# Patient Record
Sex: Female | Born: 1960
Health system: Southern US, Community
[De-identification: ages and names within clinical notes are randomized; demographics above are authoritative.]

---

## 2007-10-29 ENCOUNTER — Encounter: Admission: RE | Admit: 2007-10-29 | Discharge: 2007-10-29 | Payer: Self-pay | Admitting: Family Medicine

## 2008-12-10 ENCOUNTER — Encounter: Admission: RE | Admit: 2008-12-10 | Discharge: 2008-12-10 | Payer: Self-pay | Admitting: Family Medicine

## 2009-02-10 ENCOUNTER — Encounter: Admission: RE | Admit: 2009-02-10 | Discharge: 2009-02-10 | Payer: Self-pay | Admitting: Gastroenterology

## 2009-09-19 ENCOUNTER — Emergency Department (HOSPITAL_COMMUNITY): Admission: EM | Admit: 2009-09-19 | Discharge: 2009-09-19 | Payer: Self-pay | Admitting: Emergency Medicine

## 2009-09-21 ENCOUNTER — Emergency Department (HOSPITAL_COMMUNITY): Admission: EM | Admit: 2009-09-21 | Discharge: 2009-09-21 | Payer: Self-pay | Admitting: Emergency Medicine

## 2018-06-26 ENCOUNTER — Other Ambulatory Visit: Payer: Self-pay | Admitting: Internal Medicine

## 2018-06-26 DIAGNOSIS — Z8719 Personal history of other diseases of the digestive system: Secondary | ICD-10-CM | POA: Diagnosis not present

## 2018-06-26 DIAGNOSIS — Z1231 Encounter for screening mammogram for malignant neoplasm of breast: Secondary | ICD-10-CM

## 2018-06-26 DIAGNOSIS — F17210 Nicotine dependence, cigarettes, uncomplicated: Secondary | ICD-10-CM | POA: Diagnosis not present

## 2018-06-26 DIAGNOSIS — R030 Elevated blood-pressure reading, without diagnosis of hypertension: Secondary | ICD-10-CM | POA: Diagnosis not present

## 2018-07-16 ENCOUNTER — Other Ambulatory Visit: Payer: Self-pay | Admitting: Internal Medicine

## 2018-07-16 ENCOUNTER — Other Ambulatory Visit (HOSPITAL_COMMUNITY)
Admission: RE | Admit: 2018-07-16 | Discharge: 2018-07-16 | Disposition: A | Discharge status: Home / Self Care | Payer: 59 | Source: Ambulatory Visit | Attending: Internal Medicine | Admitting: Internal Medicine

## 2018-07-16 DIAGNOSIS — Z124 Encounter for screening for malignant neoplasm of cervix: Secondary | ICD-10-CM | POA: Diagnosis present

## 2018-07-16 DIAGNOSIS — F17210 Nicotine dependence, cigarettes, uncomplicated: Secondary | ICD-10-CM | POA: Diagnosis not present

## 2018-07-16 DIAGNOSIS — Z0000 Encounter for general adult medical examination without abnormal findings: Secondary | ICD-10-CM | POA: Diagnosis not present

## 2018-07-19 LAB — CYTOLOGY - PAP
Diagnosis: NEGATIVE
HPV: NOT DETECTED

## 2018-07-24 ENCOUNTER — Ambulatory Visit
Admission: RE | Admit: 2018-07-24 | Discharge: 2018-07-24 | Disposition: A | Discharge status: Home / Self Care | Payer: 59 | Source: Ambulatory Visit | Attending: Internal Medicine | Admitting: Internal Medicine

## 2018-07-24 DIAGNOSIS — Z1231 Encounter for screening mammogram for malignant neoplasm of breast: Secondary | ICD-10-CM | POA: Diagnosis not present

## 2018-08-31 DIAGNOSIS — R51 Headache: Secondary | ICD-10-CM | POA: Diagnosis not present

## 2018-08-31 DIAGNOSIS — R0789 Other chest pain: Secondary | ICD-10-CM | POA: Diagnosis not present

## 2019-09-22 ENCOUNTER — Ambulatory Visit
Admission: EM | Admit: 2019-09-22 | Discharge: 2019-09-22 | Disposition: A | Discharge status: Home / Self Care | Payer: 59

## 2019-09-22 ENCOUNTER — Other Ambulatory Visit: Payer: Self-pay

## 2019-09-22 ENCOUNTER — Encounter: Payer: Self-pay | Admitting: Emergency Medicine

## 2019-09-22 DIAGNOSIS — Z720 Tobacco use: Secondary | ICD-10-CM

## 2019-09-22 DIAGNOSIS — J069 Acute upper respiratory infection, unspecified: Secondary | ICD-10-CM | POA: Diagnosis not present

## 2019-09-22 MED ORDER — AZITHROMYCIN 250 MG PO TABS: 250.0000 mg | ORAL_TABLET | Freq: Every day | ORAL | 0 refills | Status: DC

## 2019-09-22 MED ORDER — BENZONATATE 100 MG PO CAPS: 100.0000 mg | ORAL_CAPSULE | Freq: Three times a day (TID) | ORAL | 0 refills | Status: DC

## 2019-09-22 NOTE — Discharge Instructions (Addendum)

## 2019-09-22 NOTE — ED Provider Notes (Signed)
[facility]    CSN: [id number] Arrival date & time: 09/22/19  1015      History   Chief Complaint Chief Complaint  Patient presents with  . URI    HPI Kathryn Harrison is a 59 y.o. female with history of tobacco use (currently 1/2 pack/day) presenting for URI symptoms x4 days.  Endorsing fatigue, malaise, rhinorrhea, chest congestion, dry/nonproductive cough.  No fever, chest pain, difficulty breathing.  No known Covid exposures, not currently taking anything for symptoms.   History reviewed. No pertinent past medical history.  There are no problems to display for this patient.   History reviewed. No pertinent surgical history.  OB History   No obstetric history on file.      Home Medications    Prior to Admission medications   Medication Sig Start Date End Date Taking? Authorizing Provider  aspirin EC 81 MG tablet Take 81 mg by mouth daily.   Yes [provider]  Multiple Vitamin (MULTIVITAMIN) tablet Take 1 tablet by mouth daily.   Yes [provider]  VITAMIN D PO Take by mouth.   Yes [provider]  azithromycin (ZITHROMAX) 250 MG tablet Take 1 tablet (250 mg total) by mouth daily. Take first 2 tablets together, then 1 every day until finished. 09/22/19   Hall-Potvin, [place], PA-C  benzonatate (TESSALON) 100 MG capsule Take 1 capsule (100 mg total) by mouth every 8 (eight) hours. 09/22/19   Hall-Potvin, [place], PA-C    Family History Family History  Problem Relation Age of Onset  . Hypertension Mother     Social History Social History   Tobacco Use  . Smoking status: Current Every Day Smoker  Substance Use Topics  . Alcohol use: Yes  . Drug use: Never     Allergies   Patient has no known allergies.   Review of Systems As per HPI   Physical Exam Triage Vital Signs ED Triage Vitals  Enc Vitals Group     BP 09/22/19 1031 (!) 151/89     Pulse Rate 09/22/19 1031 83     Resp 09/22/19 1031 18     Temp  09/22/19 1031 98.4 F (36.9 C)     Temp Source 09/22/19 1031 Oral     SpO2 09/22/19 1031 98 %     Weight --      Height --      Head Circumference --      Peak Flow --      Pain Score 09/22/19 1026 0     Pain Loc --      Pain Edu? --      Excl. in [place]? --    No data found.  Updated Vital Signs BP (!) 151/89 (BP Location: Left Arm)   Pulse 83   Temp 98.4 F (36.9 C) (Oral)   Resp 18   SpO2 98%   Visual Acuity Right Eye Distance:   Left Eye Distance:   Bilateral Distance:    Right Eye Near:   Left Eye Near:    Bilateral Near:     Physical Exam Constitutional:      General: She is not in acute distress.    Appearance: She is obese. She is not ill-appearing or diaphoretic.  HENT:     Head: Normocephalic and atraumatic.     Mouth/Throat:     Mouth: Mucous membranes are moist.     Pharynx: Oropharynx is clear. No oropharyngeal exudate or posterior oropharyngeal erythema.  Eyes:  General: No scleral icterus.    Conjunctiva/sclera: Conjunctivae normal.     Pupils: Pupils are equal, round, and reactive to light.  Neck:     Comments: Trachea midline, negative JVD Cardiovascular:     Rate and Rhythm: Normal rate and regular rhythm.     Heart sounds: No murmur. No gallop.   Pulmonary:     Effort: Pulmonary effort is normal. No respiratory distress.     Breath sounds: No wheezing, rhonchi or rales.  Musculoskeletal:     Cervical back: Neck supple. No tenderness.  Lymphadenopathy:     Cervical: No cervical adenopathy.  Skin:    Capillary Refill: Capillary refill takes less than 2 seconds.     Coloration: Skin is not jaundiced or pale.     Findings: No rash.  Neurological:     General: No focal deficit present.     Mental Status: She is alert and oriented to person, place, and time.      UC Treatments / Results  Labs (all labs ordered are listed, but only abnormal results are displayed) Labs Reviewed  NOVEL CORONAVIRUS, NAA    EKG   Radiology No  results found.  Procedures Procedures (including critical care time)  Medications Ordered in UC Medications - No data to display  Initial Impression / Assessment and Plan / UC Course  I have reviewed the triage vital signs and the nursing notes.  Pertinent labs & imaging results that were available during my care of the patient were reviewed by me and considered in my medical decision making (see chart for details).     Patient afebrile, nontoxic, with SpO2 98%.  Patient reports recurrent bronchitis but denies recent hospitalization for breathing issues.  Given persistent tobacco use with setting of recurrent bronchitis, willing to provide Z-Pak which patient has tolerated well in the past.  Covid PCR pending.  Patient to quarantine until results are back.  We will treat supportively as outlined below.  Return precautions discussed, patient verbalized understanding and is agreeable to plan. Final Clinical Impressions(s) / UC Diagnoses   Final diagnoses:  URI with cough and congestion     Discharge Instructions     Tessalon for cough. Start flonase, atrovent nasal spray for nasal congestion/drainage. You can use over the counter nasal saline rinse such as neti pot for nasal congestion. Keep hydrated, your urine should be clear to pale yellow in color. Tylenol/motrin for fever and pain. Monitor for any worsening of symptoms, chest pain, shortness of breath, wheezing, swelling of the throat, go to the emergency department for further evaluation needed.     ED Prescriptions    Medication Sig Dispense Auth. Provider   azithromycin (ZITHROMAX) 250 MG tablet Take 1 tablet (250 mg total) by mouth daily. Take first 2 tablets together, then 1 every day until finished. 6 tablet Hall-Potvin, [place], PA-C   benzonatate (TESSALON) 100 MG capsule Take 1 capsule (100 mg total) by mouth every 8 (eight) hours. 21 capsule Hall-Potvin, [place], PA-C     PDMP not reviewed this encounter.    Hall-Potvin, [place], [place] 09/22/19 1257

## 2019-09-22 NOTE — ED Triage Notes (Signed)
Onset 4 days ago.  Patient has had runny nose, chest congestion, minimal cough.  Denies fever.  Today has started feeling drained, off balance, feels like she is in a fog

## 2019-09-23 LAB — NOVEL CORONAVIRUS, NAA: SARS-CoV-2, NAA: NOT DETECTED

## 2019-09-23 LAB — SARS-COV-2, NAA 2 DAY TAT

## 2019-09-25 ENCOUNTER — Other Ambulatory Visit: Payer: Self-pay | Admitting: Internal Medicine

## 2019-09-25 DIAGNOSIS — Z1231 Encounter for screening mammogram for malignant neoplasm of breast: Secondary | ICD-10-CM

## 2019-09-27 ENCOUNTER — Ambulatory Visit
Admission: RE | Admit: 2019-09-27 | Discharge: 2019-09-27 | Disposition: A | Discharge status: Home / Self Care | Payer: 59 | Source: Ambulatory Visit | Attending: Internal Medicine | Admitting: Internal Medicine

## 2019-09-27 ENCOUNTER — Other Ambulatory Visit: Payer: Self-pay

## 2019-09-27 DIAGNOSIS — Z1231 Encounter for screening mammogram for malignant neoplasm of breast: Secondary | ICD-10-CM

## 2019-09-28 ENCOUNTER — Ambulatory Visit
Admission: EM | Admit: 2019-09-28 | Discharge: 2019-09-28 | Disposition: A | Discharge status: Home / Self Care | Payer: 59 | Attending: Physician Assistant | Admitting: Physician Assistant

## 2019-09-28 ENCOUNTER — Other Ambulatory Visit: Payer: Self-pay

## 2019-09-28 DIAGNOSIS — J209 Acute bronchitis, unspecified: Secondary | ICD-10-CM | POA: Diagnosis not present

## 2019-09-28 MED ORDER — METHYLPREDNISOLONE 4 MG PO TBPK: ORAL_TABLET | ORAL | 0 refills | Status: DC

## 2019-09-28 MED ORDER — ALBUTEROL SULFATE HFA 108 (90 BASE) MCG/ACT IN AERS: 1.0000 | INHALATION_SPRAY | Freq: Four times a day (QID) | RESPIRATORY_TRACT | 0 refills | Status: AC | PRN

## 2019-09-28 MED ORDER — AZELASTINE HCL 0.1 % NA SOLN: 2.0000 | Freq: Two times a day (BID) | NASAL | 0 refills | Status: AC

## 2019-09-28 MED ORDER — AZELASTINE HCL 0.1 % NA SOLN: 2.0000 | Freq: Two times a day (BID) | NASAL | 0 refills | Status: DC

## 2019-09-28 MED ORDER — METHYLPREDNISOLONE 4 MG PO TBPK: ORAL_TABLET | ORAL | 0 refills | Status: AC

## 2019-09-28 MED ORDER — ALBUTEROL SULFATE HFA 108 (90 BASE) MCG/ACT IN AERS: 1.0000 | INHALATION_SPRAY | Freq: Four times a day (QID) | RESPIRATORY_TRACT | 0 refills | Status: DC | PRN

## 2019-09-28 NOTE — ED Triage Notes (Signed)
Patient presents for follow up of chest congestion and cough that has persisted after finishing up a Zpak x 3 days ago.

## 2019-09-28 NOTE — Discharge Instructions (Addendum)
Start medrol pack, can take at night time. If cannot tolerate steroid, can discontinue and fill albuterol as needed. Start flonase daily for the next week, if left ear symptoms not improving, can add azelastine as directed. Monitor for any worsening of symptoms, chest pain, shortness of breath, wheezing, swelling of the throat, go to the emergency department for further evaluation needed.

## 2019-09-28 NOTE — ED Provider Notes (Signed)
[facility]    CSN: [id number] Arrival date & time: 09/28/19  1038      History   Chief Complaint Chief Complaint  Patient presents with  . Cough    chest congestion    HPI Kathryn Harrison is a 59 y.o. female.   59 year old female comes in for continued chest tightness, feelings of chest congestion after being seen 5 days ago. At the time, she had 4 day history of symptoms, and was started on azithromycin. States her sinus pressure has resolved. Minimal cough. Denies fever, body aches, shortness of breath. Current everday smoker, 0.5ppd, 10-15 years.  COVID test last visit negative.      History reviewed. No pertinent past medical history.  There are no problems to display for this patient.   History reviewed. No pertinent surgical history.  OB History   No obstetric history on file.      Home Medications    Prior to Admission medications   Medication Sig Start Date End Date Taking? Authorizing Provider  albuterol (VENTOLIN HFA) 108 (90 Base) MCG/ACT inhaler Inhale 1-2 puffs into the lungs every 6 (six) hours as needed for wheezing or shortness of breath. 09/28/19   Belinda Fisher, PA-C  aspirin EC 81 MG tablet Take 81 mg by mouth daily.    [provider]  azelastine (ASTELIN) 0.1 % nasal spray Place 2 sprays into both nostrils 2 (two) times daily. 09/28/19   Cathie Hoops, Amy V, PA-C  methylPREDNISolone (MEDROL DOSEPAK) 4 MG TBPK tablet Follow pack instructions 09/28/19   Linward Headland V, PA-C  Multiple Vitamin (MULTIVITAMIN) tablet Take 1 tablet by mouth daily.    [provider]  VITAMIN D PO Take by mouth.    [provider]    Family History Family History  Problem Relation Age of Onset  . Hypertension Mother     Social History Social History   Tobacco Use  . Smoking status: Current Every Day Smoker  . Smokeless tobacco: Never Used  Substance Use Topics  . Alcohol use: Yes  . Drug use: Never     Allergies   Patient has no  known allergies.   Review of Systems Review of Systems  Reason unable to perform ROS: See HPI as above.     Physical Exam Triage Vital Signs ED Triage Vitals  Enc Vitals Group     BP 09/28/19 1046 (!) 153/92     Pulse Rate 09/28/19 1046 76     Resp 09/28/19 1046 14     Temp 09/28/19 1046 97.8 F (36.6 C)     Temp Source 09/28/19 1046 Oral     SpO2 09/28/19 1046 98 %     Weight --      Height --      Head Circumference --      Peak Flow --      Pain Score 09/28/19 1048 5     Pain Loc --      Pain Edu? --      Excl. in [place]? --    No data found.  Updated Vital Signs BP (!) 153/92 (BP Location: Left Arm)   Pulse 76   Temp 97.8 F (36.6 C) (Oral)   Resp 14   SpO2 98%   Physical Exam Constitutional:      General: She is not in acute distress.    Appearance: Normal appearance. She is not ill-appearing, toxic-appearing or diaphoretic.  HENT:     Head:  Normocephalic and atraumatic.     Right Ear: Tympanic membrane, ear canal and external ear normal. Tympanic membrane is not erythematous or bulging.     Ears:     Comments: Left ear TM with blood at 4 oclock region with blood to the ear canal. Mid ear effusion noted. Rest of TM without erythema, bulging.     Mouth/Throat:     Mouth: Mucous membranes are moist.     Pharynx: Oropharynx is clear. Uvula midline.  Cardiovascular:     Rate and Rhythm: Normal rate and regular rhythm.     Heart sounds: Normal heart sounds. No murmur. No friction rub. No gallop.   Pulmonary:     Effort: Pulmonary effort is normal. No accessory muscle usage, prolonged expiration, respiratory distress or retractions.     Comments: Lungs clear to auscultation without adventitious lung sounds. Musculoskeletal:     Cervical back: Normal range of motion and neck supple.  Neurological:     General: No focal deficit present.     Mental Status: She is alert and oriented to person, place, and time.      UC Treatments / Results  Labs (all labs  ordered are listed, but only abnormal results are displayed) Labs Reviewed - No data to display  EKG   Radiology  Procedures Procedures (including critical care time)  Medications Ordered in UC Medications - No data to display  Initial Impression / Assessment and Plan / UC Course  I have reviewed the triage vital signs and the nursing notes.  Pertinent labs & imaging results that were available during my care of the patient were reviewed by me and considered in my medical decision making (see chart for details).    Lungs clear to auscultation bilaterally without adventitious lung sounds.  O2 sat 98%.  Discussed history and exam consistent with bronchitis.  Patient with history of side effects with prednisone, will try methylprednisolone.  If still with significant side effects, can use albuterol for symptomatic relief.  Other symptomatic relief discussed.  Smoking cessation discussed.  Return precautions given.  Patient expresses understanding and agrees to plan.  Final Clinical Impressions(s) / UC Diagnoses   Final diagnoses:  Acute bronchitis, unspecified organism    ED Prescriptions    Medication Sig Dispense Auth. Provider   methylPREDNISolone (MEDROL DOSEPAK) 4 MG TBPK tablet  (Status: Discontinued) Follow pack instructions 21 each Yu, Amy V, PA-C   albuterol (VENTOLIN HFA) 108 (90 Base) MCG/ACT inhaler  (Status: Discontinued) Inhale 1-2 puffs into the lungs every 6 (six) hours as needed for wheezing or shortness of breath. 8 g Yu, Amy V, PA-C   azelastine (ASTELIN) 0.1 % nasal spray  (Status: Discontinued) Place 2 sprays into both nostrils 2 (two) times daily. 30 mL Yu, Amy V, PA-C   albuterol (VENTOLIN HFA) 108 (90 Base) MCG/ACT inhaler Inhale 1-2 puffs into the lungs every 6 (six) hours as needed for wheezing or shortness of breath. 8 g Yu, Amy V, PA-C   azelastine (ASTELIN) 0.1 % nasal spray Place 2 sprays into both nostrils 2 (two) times daily. 30 mL Yu, Amy V, PA-C    methylPREDNISolone (MEDROL DOSEPAK) 4 MG TBPK tablet Follow pack instructions 21 each Ok Edwards, PA-C     PDMP not reviewed this encounter.   Ok Edwards, PA-C 09/28/19 1143

## 2019-09-30 ENCOUNTER — Other Ambulatory Visit: Payer: Self-pay | Admitting: Internal Medicine

## 2019-09-30 DIAGNOSIS — R928 Other abnormal and inconclusive findings on diagnostic imaging of breast: Secondary | ICD-10-CM

## 2019-10-07 ENCOUNTER — Ambulatory Visit
Admission: RE | Admit: 2019-10-07 | Discharge: 2019-10-07 | Disposition: A | Discharge status: Home / Self Care | Payer: 59 | Source: Ambulatory Visit | Attending: Internal Medicine | Admitting: Internal Medicine

## 2019-10-07 ENCOUNTER — Other Ambulatory Visit: Payer: Self-pay

## 2019-10-07 ENCOUNTER — Ambulatory Visit: Payer: 59

## 2019-10-07 DIAGNOSIS — R928 Other abnormal and inconclusive findings on diagnostic imaging of breast: Secondary | ICD-10-CM

## 2020-02-01 IMAGING — MG DIGITAL SCREENING BILATERAL MAMMOGRAM WITH CAD
4 series · 4 of 4 positions shown · non-contrast
Comparison: Previous exam(s).

CLINICAL DATA: Screening.

EXAM:
DIGITAL SCREENING BILATERAL MAMMOGRAM WITH CAD

[R MLO]
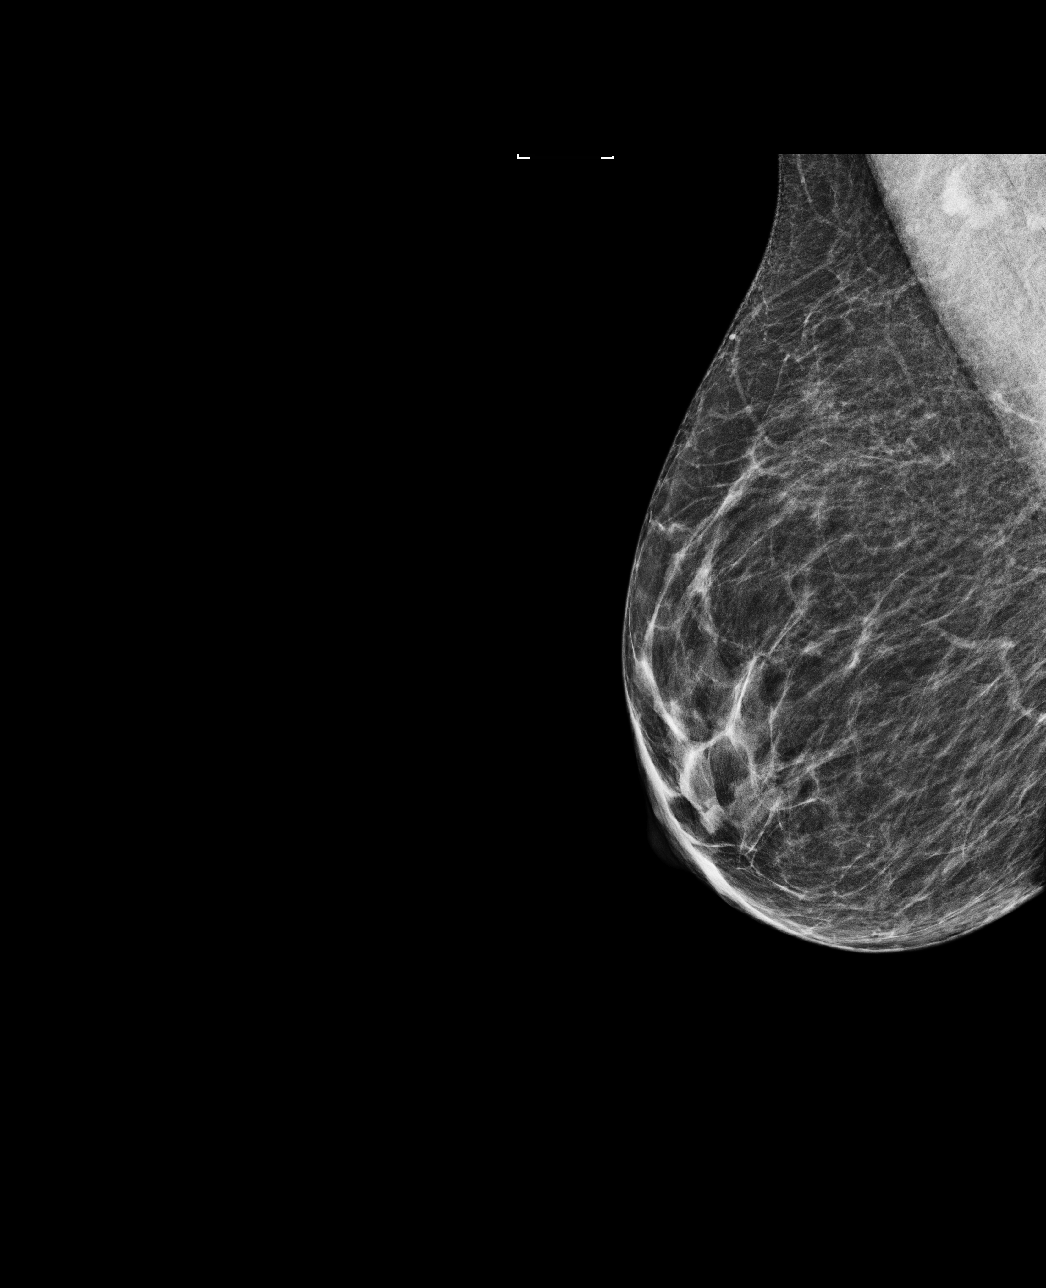

[L CC]
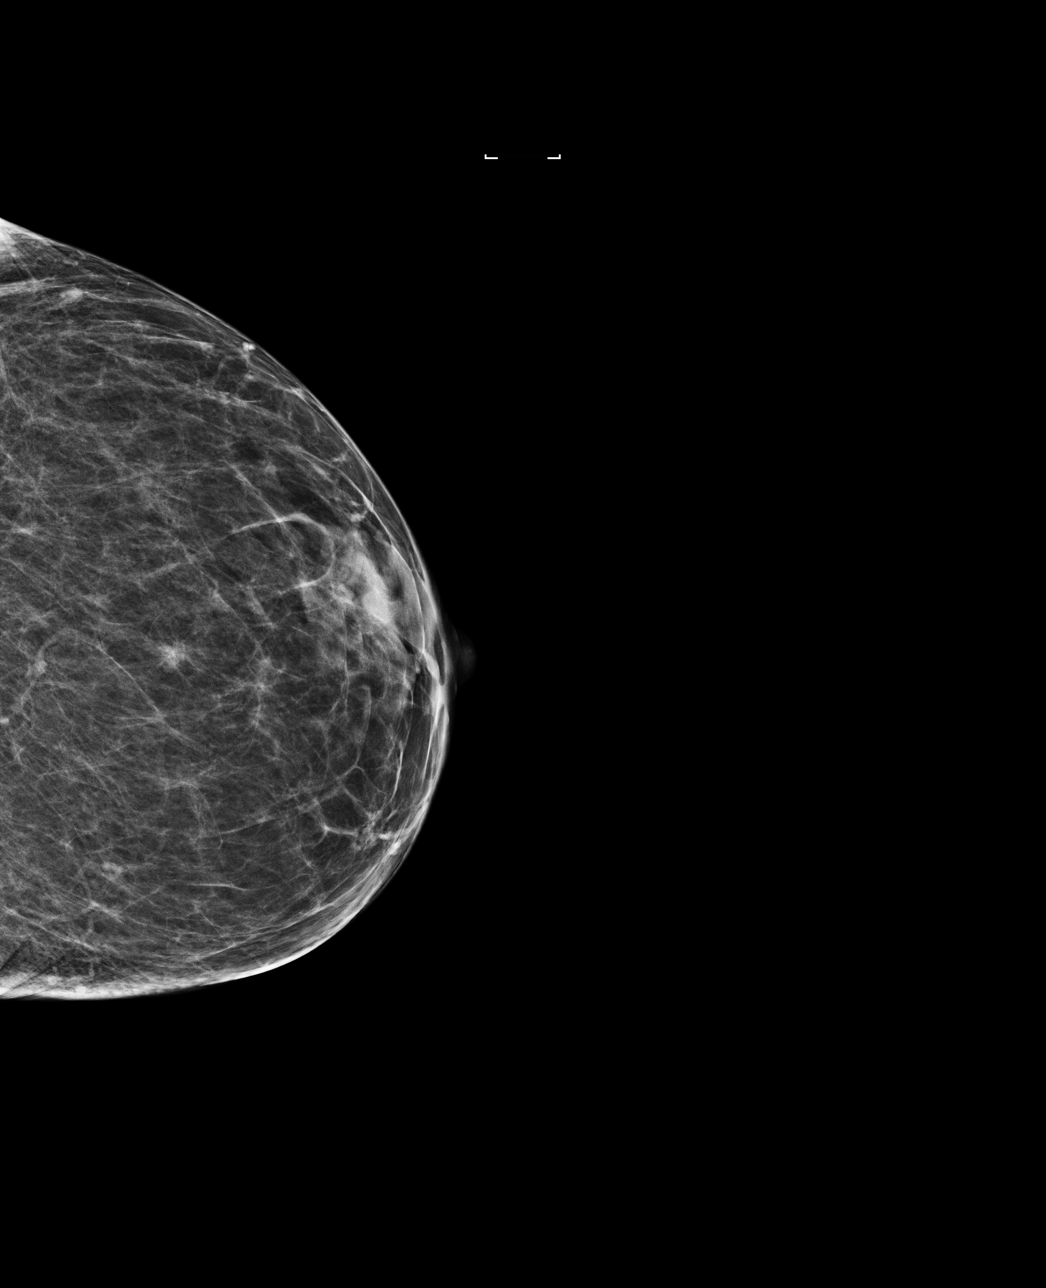

[L MLO]
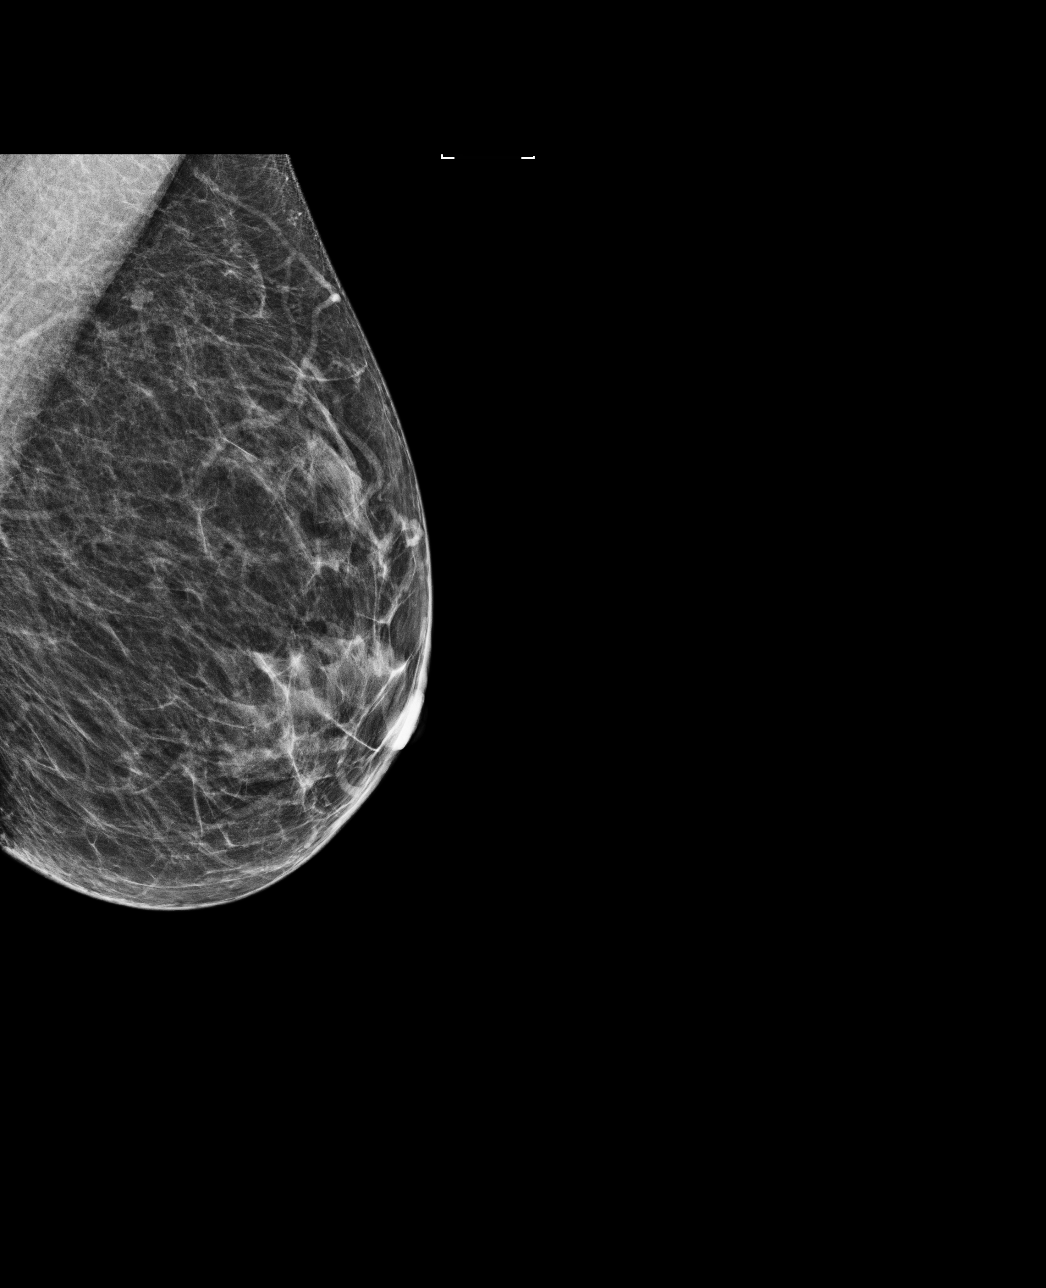

[R CC]
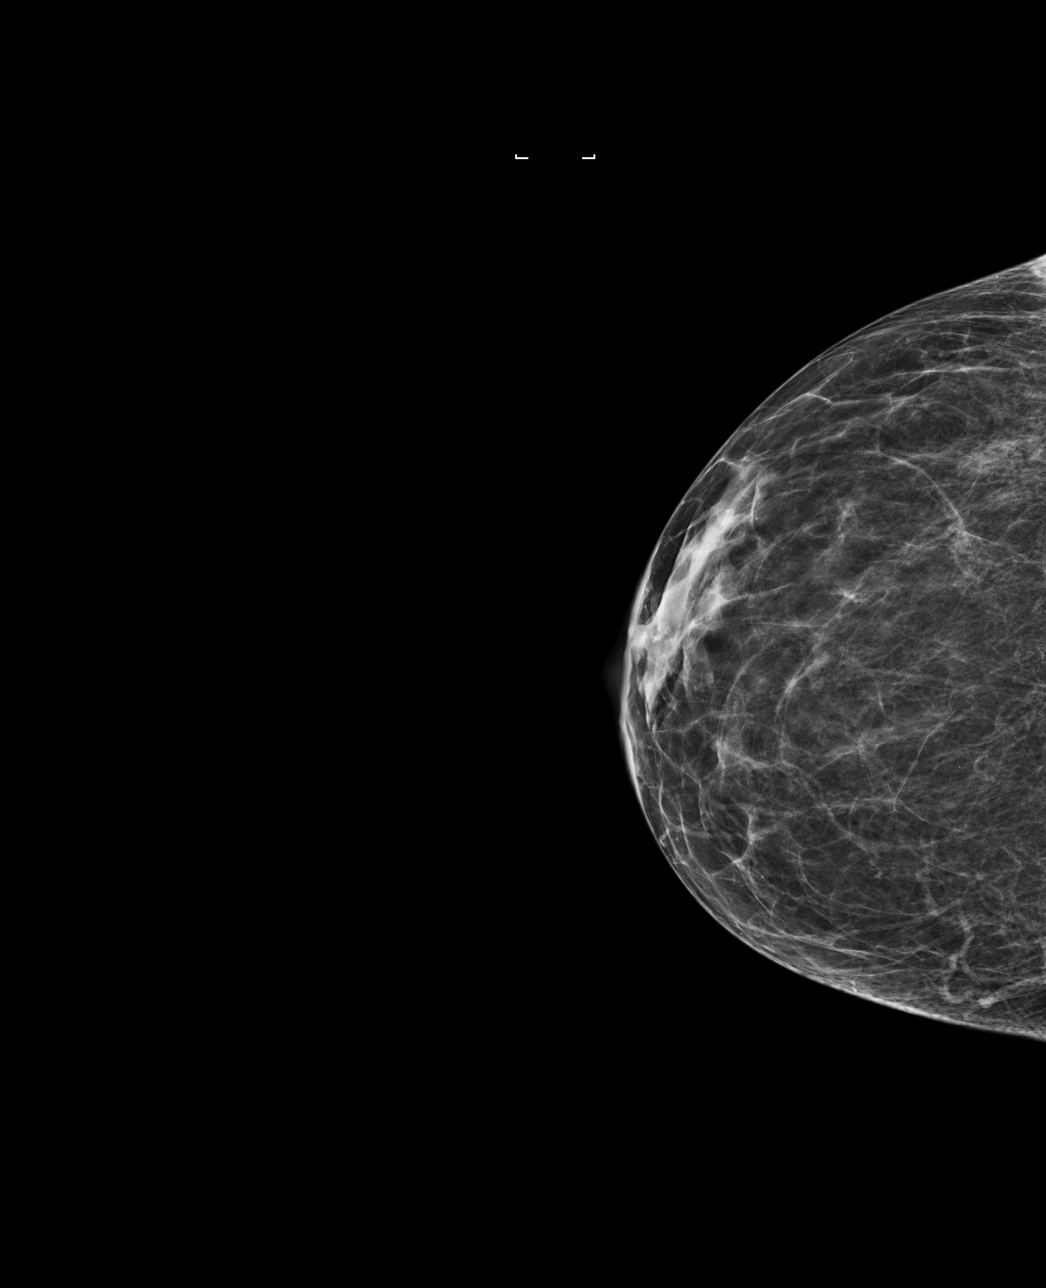

[4 of 4 positions shown; findings below may reference images not displayed]

ACR Breast Density Category b: There are scattered areas of
fibroglandular density.
FINDINGS: There are no findings suspicious for malignancy. Images were
processed with CAD.
IMPRESSION: No mammographic evidence of malignancy. A result letter of this
screening mammogram will be mailed directly to the patient.

RECOMMENDATION:
Screening mammogram in one year. (Code:AS-G-LCT)

BI-RADS CATEGORY  1: Negative.

## 2021-02-14 ENCOUNTER — Other Ambulatory Visit: Payer: Self-pay

## 2021-02-14 ENCOUNTER — Ambulatory Visit
Admission: RE | Admit: 2021-02-14 | Discharge: 2021-02-14 | Disposition: A | Discharge status: Home / Self Care | Payer: 59 | Source: Ambulatory Visit | Attending: Urgent Care | Admitting: Urgent Care

## 2021-02-14 VITALS — BP 145/89 | HR 79 | Temp 98.1°F | Resp 18

## 2021-02-14 DIAGNOSIS — R059 Cough, unspecified: Secondary | ICD-10-CM

## 2021-02-14 DIAGNOSIS — J3489 Other specified disorders of nose and nasal sinuses: Secondary | ICD-10-CM

## 2021-02-14 DIAGNOSIS — F17200 Nicotine dependence, unspecified, uncomplicated: Secondary | ICD-10-CM

## 2021-02-14 DIAGNOSIS — J018 Other acute sinusitis: Secondary | ICD-10-CM | POA: Diagnosis not present

## 2021-02-14 MED ORDER — PROMETHAZINE-DM 6.25-15 MG/5ML PO SYRP: 5.0000 mL | ORAL_SOLUTION | Freq: Every evening | ORAL | 0 refills | Status: AC | PRN

## 2021-02-14 MED ORDER — LEVOCETIRIZINE DIHYDROCHLORIDE 5 MG PO TABS: 5.0000 mg | ORAL_TABLET | Freq: Every evening | ORAL | 0 refills | Status: AC

## 2021-02-14 MED ORDER — AMOXICILLIN-POT CLAVULANATE 875-125 MG PO TABS: 1.0000 | ORAL_TABLET | Freq: Two times a day (BID) | ORAL | 0 refills | Status: AC

## 2021-02-14 MED ORDER — PSEUDOEPHEDRINE HCL 60 MG PO TABS: 60.0000 mg | ORAL_TABLET | Freq: Three times a day (TID) | ORAL | 0 refills | Status: AC | PRN

## 2021-02-14 NOTE — ED Triage Notes (Signed)
Pt here for nasal congestion x cough x 1 week; pt sts neg covid test x 2; pt sts some sinus pressure

## 2021-02-14 NOTE — ED Provider Notes (Signed)
[facility]   MRN: [id number] DOB: 03/09/1961  Subjective:   Kathryn Harrison is a 60 y.o. female presenting for 1 week history of persistent and worsening sinus congestion, sinus pressure, bilateral ear fullness and popping.  She is also had a cough.  Feels like she has a lot of throat congestion.  No chest pain, shortness of breath or wheezing.  Cough and sinus symptoms are worse when she lays down.  Has had COVID test at home which were negative.  Does not want this testing repeated.  She is a smoker, currently does 1/2ppd.  Denies history of respiratory disorders, COPD, asthma.  No current facility-administered medications for this encounter.  Current Outpatient Medications:    amoxicillin-clavulanate (AUGMENTIN) 875-125 MG tablet, Take 1 tablet by mouth every 12 (twelve) hours., Disp: 14 tablet, Rfl: 0   levocetirizine (XYZAL) 5 MG tablet, Take 1 tablet (5 mg total) by mouth every evening., Disp: 90 tablet, Rfl: 0   promethazine-dextromethorphan (PROMETHAZINE-DM) 6.25-15 MG/5ML syrup, Take 5 mLs by mouth at bedtime as needed for cough., Disp: 100 mL, Rfl: 0   pseudoephedrine (SUDAFED) 60 MG tablet, Take 1 tablet (60 mg total) by mouth every 8 (eight) hours as needed for congestion., Disp: 30 tablet, Rfl: 0   albuterol (VENTOLIN HFA) 108 (90 Base) MCG/ACT inhaler, Inhale 1-2 puffs into the lungs every 6 (six) hours as needed for wheezing or shortness of breath., Disp: 8 g, Rfl: 0   aspirin EC 81 MG tablet, Take 81 mg by mouth daily., Disp: , Rfl:    azelastine (ASTELIN) 0.1 % nasal spray, Place 2 sprays into both nostrils 2 (two) times daily., Disp: 30 mL, Rfl: 0   methylPREDNISolone (MEDROL DOSEPAK) 4 MG TBPK tablet, Follow pack instructions (Patient not taking: Reported on 02/14/2021), Disp: 21 each, Rfl: 0   Multiple Vitamin (MULTIVITAMIN) tablet, Take 1 tablet by mouth daily., Disp: , Rfl:    VITAMIN D PO, Take by mouth., Disp: , Rfl:    No Known Allergies  History  reviewed. No pertinent past medical history.   History reviewed. No pertinent surgical history.  Family History  Problem Relation Age of Onset   Hypertension Mother     Social History   Tobacco Use   Smoking status: Every Day   Smokeless tobacco: Never  Substance Use Topics   Alcohol use: Yes   Drug use: Never    ROS   Objective:   Vitals: BP (!) 145/89 (BP Location: Left Arm)   Pulse 79   Temp 98.1 F (36.7 C) (Oral)   Resp 18   SpO2 98%   Physical Exam Constitutional:      General: She is not in acute distress.    Appearance: Normal appearance. She is well-developed. She is not ill-appearing, toxic-appearing or diaphoretic.  HENT:     Head: Normocephalic and atraumatic.     Right Ear: Tympanic membrane, ear canal and external ear normal. No drainage or tenderness. No middle ear effusion. Tympanic membrane is not erythematous.     Left Ear: Tympanic membrane, ear canal and external ear normal. No drainage or tenderness.  No middle ear effusion. Tympanic membrane is not erythematous.     Nose: Congestion present. No rhinorrhea.     Mouth/Throat:     Mouth: Mucous membranes are moist. No oral lesions.     Pharynx: No pharyngeal swelling, oropharyngeal exudate, posterior oropharyngeal erythema or uvula swelling.     Tonsils: No tonsillar exudate or tonsillar abscesses.  Eyes:  General: No scleral icterus.       Right eye: No discharge.        Left eye: No discharge.     Extraocular Movements: Extraocular movements intact.     Right eye: Normal extraocular motion.     Left eye: Normal extraocular motion.     Conjunctiva/sclera: Conjunctivae normal.     Pupils: Pupils are equal, round, and reactive to light.  Cardiovascular:     Rate and Rhythm: Normal rate and regular rhythm.     Pulses: Normal pulses.     Heart sounds: Normal heart sounds. No murmur heard.   No friction rub. No gallop.  Pulmonary:     Effort: Pulmonary effort is normal. No respiratory  distress.     Breath sounds: Normal breath sounds. No stridor. No wheezing, rhonchi or rales.  Musculoskeletal:     Cervical back: Normal range of motion and neck supple.  Lymphadenopathy:     Cervical: No cervical adenopathy.  Skin:    General: Skin is warm and dry.     Findings: No rash.  Neurological:     General: No focal deficit present.     Mental Status: She is alert and oriented to person, place, and time.  Psychiatric:        Mood and Affect: Mood normal.        Behavior: Behavior normal.        Thought Content: Thought content normal.    Assessment and Plan :   PDMP not reviewed this encounter.  1. Acute non-recurrent sinusitis of other sinus   2. Cough   3. Sinus pressure   4. Smoker     Will start empiric treatment for sinusitis with Augmentin.  Offered patient a steroid course which she refused.  She also refused a COVID-19 test.  Deferred imaging given clear cardiopulmonary exam, no fever, 98% pulse oximetry.  Recommended supportive care otherwise including the use of oral antihistamine, decongestant. Counseled patient on potential for adverse effects with medications prescribed/recommended today, ER and return-to-clinic precautions discussed, patient verbalized understanding.    Wallis Bamberg, PA-C 02/14/21 1048

## 2021-04-06 IMAGING — MG DIGITAL SCREENING BILAT W/ CAD
4 series · 4 of 4 positions shown · non-contrast
Comparison: Previous exam(s).

CLINICAL DATA: Screening.

EXAM:
DIGITAL SCREENING BILATERAL MAMMOGRAM WITH CAD

[R CC]
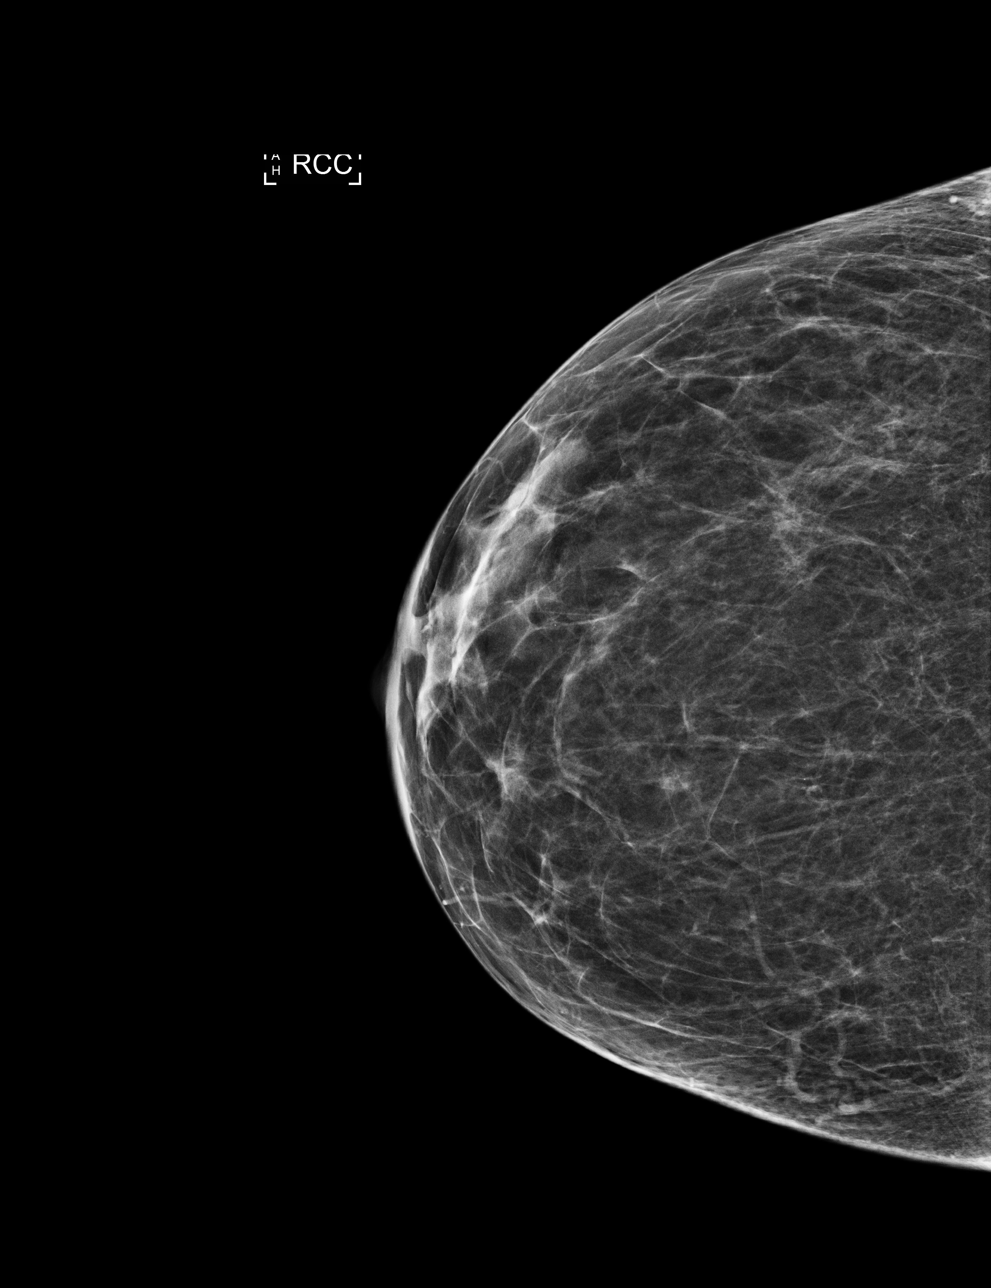

[L MLO]
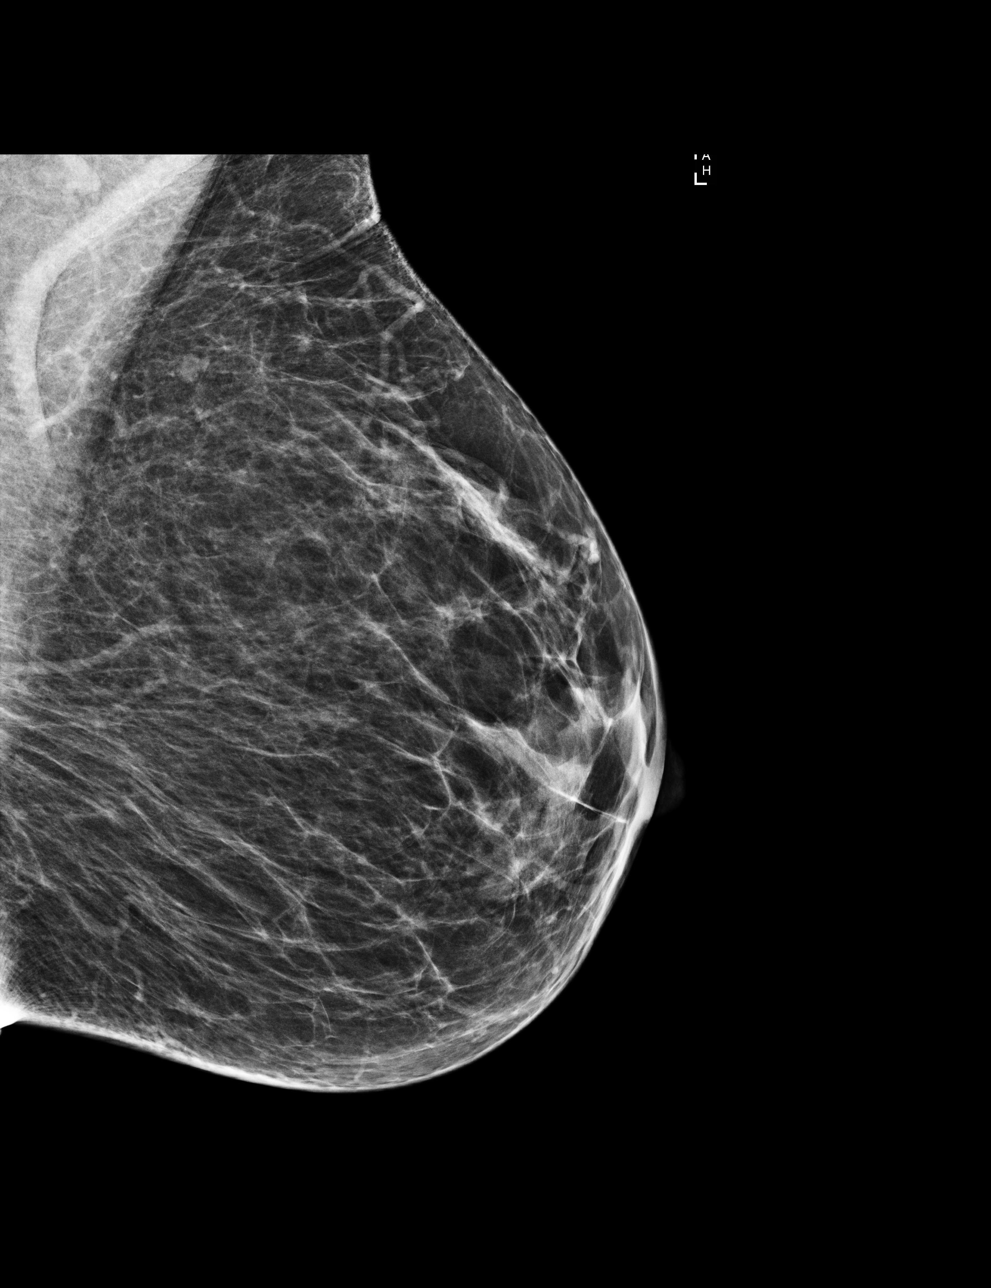

[L CC]
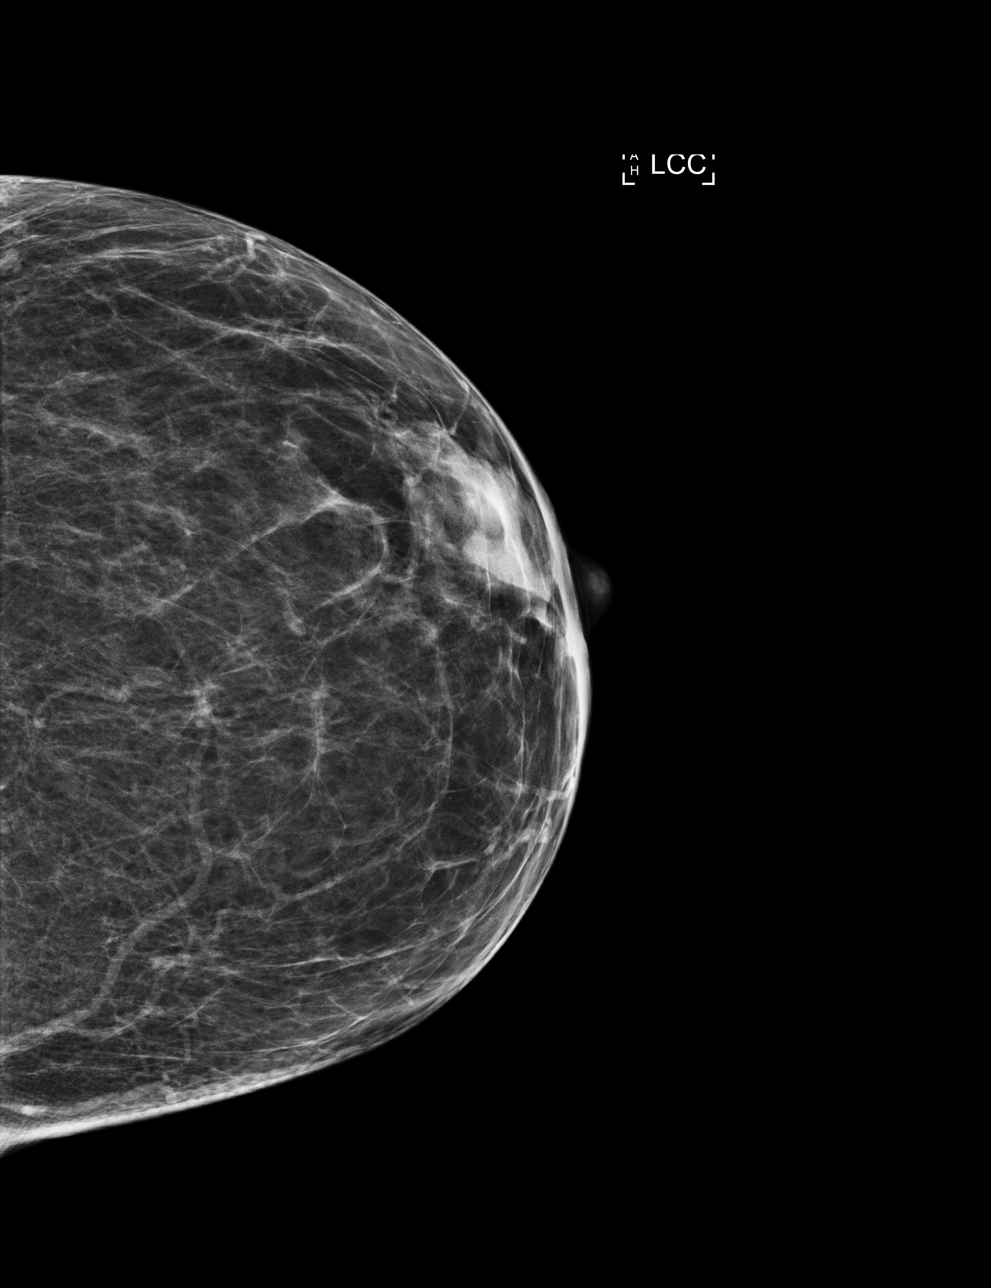

[R MLO]
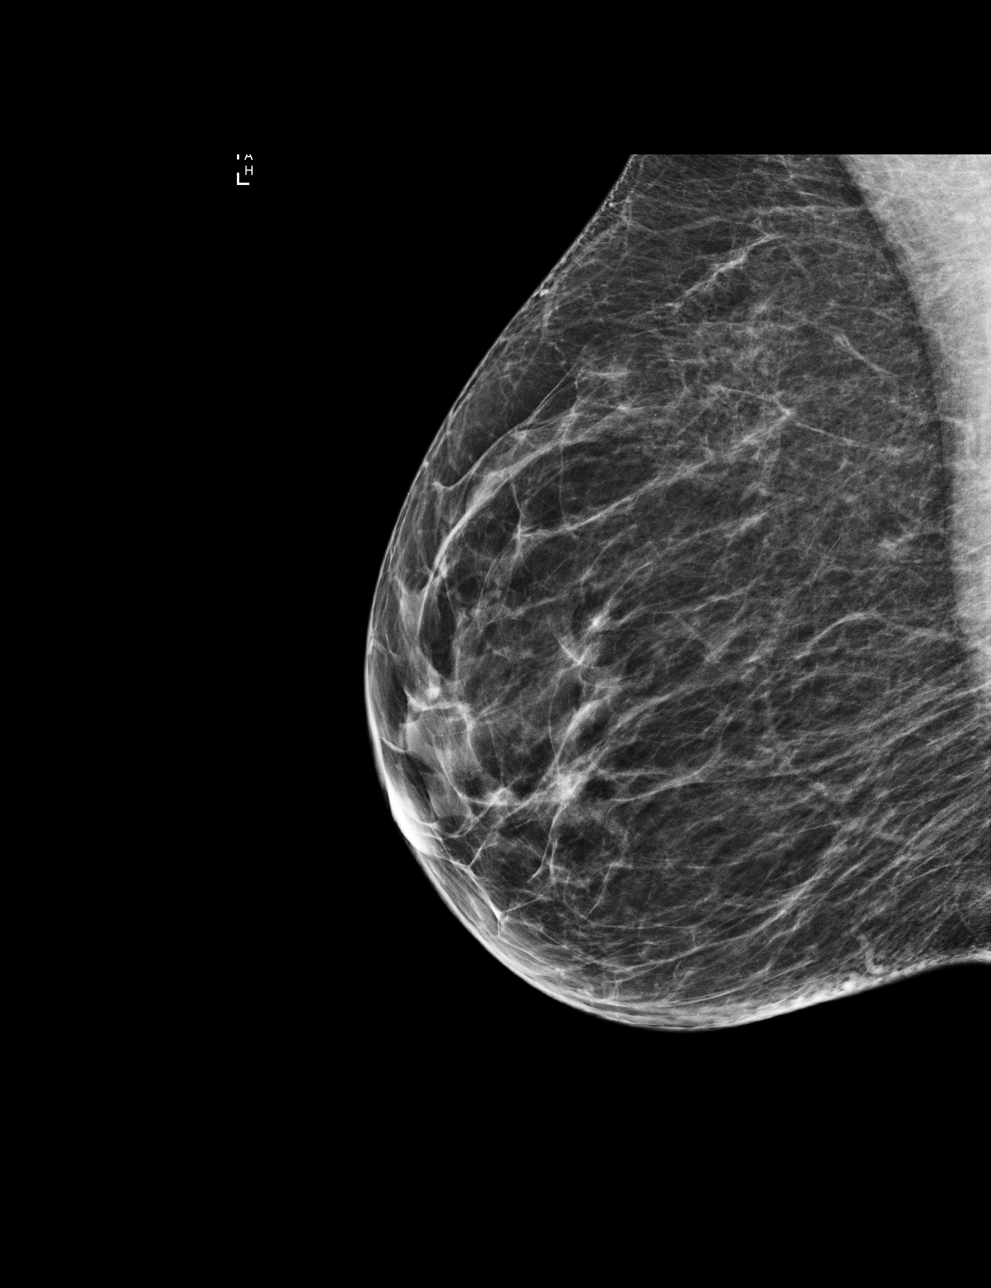

[4 of 4 positions shown; findings below may reference images not displayed]

ACR Breast Density Category b: There are scattered areas of
fibroglandular density.
FINDINGS: In the right breast, a possible asymmetry warrants further
evaluation. In the left breast, no findings suspicious for
malignancy. Images were processed with CAD.
IMPRESSION: Further evaluation is suggested for possible asymmetry in the right
breast.

RECOMMENDATION:
Diagnostic mammogram and possibly ultrasound of the right breast.
(Code:4S-E-YY3)

The patient will be contacted regarding the findings, and additional
imaging will be scheduled.

BI-RADS CATEGORY  0: Incomplete. Need additional imaging evaluation
and/or prior mammograms for comparison.

## 2021-06-24 ENCOUNTER — Other Ambulatory Visit: Payer: Self-pay | Admitting: Internal Medicine

## 2021-06-24 DIAGNOSIS — Z1231 Encounter for screening mammogram for malignant neoplasm of breast: Secondary | ICD-10-CM

## 2021-08-02 ENCOUNTER — Ambulatory Visit: Payer: 59

## 2021-08-23 ENCOUNTER — Ambulatory Visit: Payer: 59

## 2022-06-06 ENCOUNTER — Other Ambulatory Visit: Payer: Self-pay | Admitting: Internal Medicine

## 2022-06-06 DIAGNOSIS — Z1231 Encounter for screening mammogram for malignant neoplasm of breast: Secondary | ICD-10-CM

## 2022-08-03 ENCOUNTER — Ambulatory Visit: Payer: Medicaid Other

## 2022-08-16 ENCOUNTER — Ambulatory Visit
Admission: RE | Admit: 2022-08-16 | Discharge: 2022-08-16 | Disposition: A | Discharge status: Home / Self Care | Payer: Medicaid Other | Source: Ambulatory Visit | Attending: Internal Medicine | Admitting: Internal Medicine

## 2022-08-16 DIAGNOSIS — Z1231 Encounter for screening mammogram for malignant neoplasm of breast: Secondary | ICD-10-CM

## 2023-08-29 ENCOUNTER — Other Ambulatory Visit: Payer: Self-pay | Admitting: Internal Medicine

## 2023-08-29 DIAGNOSIS — Z1231 Encounter for screening mammogram for malignant neoplasm of breast: Secondary | ICD-10-CM

## 2023-09-13 ENCOUNTER — Ambulatory Visit
Admission: RE | Admit: 2023-09-13 | Discharge: 2023-09-13 | Disposition: A | Discharge status: Home / Self Care | Source: Ambulatory Visit | Attending: Internal Medicine | Admitting: Internal Medicine

## 2023-09-13 DIAGNOSIS — Z1231 Encounter for screening mammogram for malignant neoplasm of breast: Secondary | ICD-10-CM
